# Patient Record
Sex: Male | Born: 1970
Health system: Southern US, Community
[De-identification: ages and names within clinical notes are randomized; demographics above are authoritative.]

---

## 1998-06-16 ENCOUNTER — Emergency Department (HOSPITAL_COMMUNITY): Admission: EM | Admit: 1998-06-16 | Discharge: 1998-06-16 | Payer: Self-pay | Admitting: Internal Medicine

## 1998-07-13 ENCOUNTER — Emergency Department (HOSPITAL_COMMUNITY): Admission: EM | Admit: 1998-07-13 | Discharge: 1998-07-13 | Payer: Self-pay | Admitting: Emergency Medicine

## 1999-01-12 ENCOUNTER — Emergency Department (HOSPITAL_COMMUNITY): Admission: EM | Admit: 1999-01-12 | Discharge: 1999-01-12 | Payer: Self-pay

## 1999-07-31 ENCOUNTER — Emergency Department (HOSPITAL_COMMUNITY): Admission: EM | Admit: 1999-07-31 | Discharge: 1999-07-31 | Payer: Self-pay | Admitting: Emergency Medicine

## 2001-02-07 ENCOUNTER — Emergency Department (HOSPITAL_COMMUNITY): Admission: EM | Admit: 2001-02-07 | Discharge: 2001-02-07 | Payer: Self-pay | Admitting: Emergency Medicine

## 2001-02-10 ENCOUNTER — Emergency Department (HOSPITAL_COMMUNITY): Admission: EM | Admit: 2001-02-10 | Discharge: 2001-02-10 | Payer: Self-pay

## 2001-08-06 ENCOUNTER — Inpatient Hospital Stay (HOSPITAL_COMMUNITY): Admission: EM | Admit: 2001-08-06 | Discharge: 2001-08-07 | Payer: Self-pay | Admitting: Emergency Medicine

## 2001-08-06 ENCOUNTER — Encounter (INDEPENDENT_AMBULATORY_CARE_PROVIDER_SITE_OTHER): Payer: Self-pay | Admitting: Specialist

## 2001-08-06 ENCOUNTER — Encounter: Payer: Self-pay | Admitting: Emergency Medicine

## 2002-06-02 ENCOUNTER — Encounter: Payer: Self-pay | Admitting: Emergency Medicine

## 2002-06-02 ENCOUNTER — Emergency Department (HOSPITAL_COMMUNITY): Admission: EM | Admit: 2002-06-02 | Discharge: 2002-06-02 | Payer: Self-pay | Admitting: Emergency Medicine

## 2004-03-10 ENCOUNTER — Emergency Department (HOSPITAL_COMMUNITY): Admission: EM | Admit: 2004-03-10 | Discharge: 2004-03-10 | Payer: Self-pay | Admitting: Emergency Medicine

## 2006-07-03 ENCOUNTER — Emergency Department (HOSPITAL_COMMUNITY): Admission: EM | Admit: 2006-07-03 | Discharge: 2006-07-03 | Payer: Self-pay | Admitting: Emergency Medicine

## 2006-08-12 ENCOUNTER — Emergency Department (HOSPITAL_COMMUNITY): Admission: EM | Admit: 2006-08-12 | Discharge: 2006-08-12 | Payer: Self-pay | Admitting: Emergency Medicine

## 2008-12-30 ENCOUNTER — Encounter: Admission: RE | Admit: 2008-12-30 | Discharge: 2008-12-30 | Payer: Self-pay | Admitting: Emergency Medicine

## 2010-12-04 ENCOUNTER — Encounter: Payer: Self-pay | Admitting: Emergency Medicine

## 2011-03-31 NOTE — Op Note (Signed)
[facility]  Patient:    Ronald Lawson, Ronald Lawson Visit Number: [id number] MRN: [id number]          Service Type: MED Location: 1E 0103 01 Attending Physician:  Tobey Bride Dictated by:   Zigmund Daniel, M.D. Proc. Date: 08/07/01 Admit Date:  08/06/2001                             Operative Report  PREOPERATIVE DIAGNOSIS:   Acute appendicitis.  POSTOPERATIVE DIAGNOSIS:  Acute appendicitis.  OPERATION:  Laparoscopic appendectomy.  SURGEON:  Zigmund Daniel, M.D.  ANESTHESIA:  General.  DESCRIPTION OF PROCEDURE:  After the patient was adequately monitored and anesthetized, she had routine preparation and draping of the abdomen.  I made a short transverse infraumbilical incision and sized the fascia longitudinally.  I opened the peritoneum bluntly, placed an 0 Vicryl pursestring suture in the fascia, secured a Hasson cannula and inflated the abdomen with C02.  I was able to identify retrocecal, partially retroperitoneal inflamed appendix.  I placed a 5 mm right mid abdominal port and an 11 mm suprapubic port under direct vision and then freed up adhesions of the appendix until I could get across it with the stapler.  It was rather broad-based near the cecum and in order to adequately secure the appendix and the mesentery required there firings of the endovascular stapler.  However, I then was able top detach the appendix, place it in a plastic pouch, and remove it through the umbilical incision.  Hemostasis was good.  There was no abscess or free fluid.  The closure of the cecum appeared secure.  The patient was stable throughout the operation.  All of the sponge, needle and instrument counts were correct.  I removed the appendix and a plastic pouch through an umbilical incision and tied the pursestring suture and removed the lateral port under direction vision and then removed the suprapubic port after releasing the C02.  I closed all skin  incisions with intracuticular 4-0 Vicryl and Steri-Strips.  He tolerated the operation well. Dictated by:   Zigmund Daniel, M.D. Attending Physician:  Tobey Bride DD:  08/07/01 TD:  08/07/01 Job: [id number]

## 2017-12-20 DIAGNOSIS — J069 Acute upper respiratory infection, unspecified: Secondary | ICD-10-CM | POA: Diagnosis not present

## 2017-12-20 DIAGNOSIS — J208 Acute bronchitis due to other specified organisms: Secondary | ICD-10-CM | POA: Diagnosis not present

## 2017-12-20 DIAGNOSIS — F411 Generalized anxiety disorder: Secondary | ICD-10-CM | POA: Diagnosis not present

## 2017-12-20 DIAGNOSIS — Z6825 Body mass index (BMI) 25.0-25.9, adult: Secondary | ICD-10-CM | POA: Diagnosis not present

## 2018-02-18 DIAGNOSIS — Z6826 Body mass index (BMI) 26.0-26.9, adult: Secondary | ICD-10-CM | POA: Diagnosis not present

## 2018-02-18 DIAGNOSIS — J302 Other seasonal allergic rhinitis: Secondary | ICD-10-CM | POA: Diagnosis not present

## 2018-04-22 DIAGNOSIS — Z1331 Encounter for screening for depression: Secondary | ICD-10-CM | POA: Diagnosis not present

## 2018-04-22 DIAGNOSIS — R6882 Decreased libido: Secondary | ICD-10-CM | POA: Diagnosis not present

## 2018-04-22 DIAGNOSIS — F411 Generalized anxiety disorder: Secondary | ICD-10-CM | POA: Diagnosis not present

## 2018-04-22 DIAGNOSIS — Z0000 Encounter for general adult medical examination without abnormal findings: Secondary | ICD-10-CM | POA: Diagnosis not present

## 2018-04-22 DIAGNOSIS — Z6826 Body mass index (BMI) 26.0-26.9, adult: Secondary | ICD-10-CM | POA: Diagnosis not present

## 2018-05-17 DIAGNOSIS — Z6825 Body mass index (BMI) 25.0-25.9, adult: Secondary | ICD-10-CM | POA: Diagnosis not present

## 2018-05-17 DIAGNOSIS — M25572 Pain in left ankle and joints of left foot: Secondary | ICD-10-CM | POA: Diagnosis not present

## 2018-11-26 DIAGNOSIS — F411 Generalized anxiety disorder: Secondary | ICD-10-CM | POA: Diagnosis not present

## 2018-11-26 DIAGNOSIS — R001 Bradycardia, unspecified: Secondary | ICD-10-CM | POA: Diagnosis not present

## 2018-11-26 DIAGNOSIS — Z6826 Body mass index (BMI) 26.0-26.9, adult: Secondary | ICD-10-CM | POA: Diagnosis not present

## 2021-09-22 ENCOUNTER — Telehealth: Payer: Self-pay

## 2021-09-22 NOTE — Telephone Encounter (Signed)
NOTES SCANNED TO REFERRAL 

## 2021-10-12 ENCOUNTER — Other Ambulatory Visit: Payer: Self-pay

## 2021-10-12 ENCOUNTER — Ambulatory Visit (INDEPENDENT_AMBULATORY_CARE_PROVIDER_SITE_OTHER): Payer: Self-pay | Admitting: Cardiovascular Disease

## 2021-10-12 ENCOUNTER — Encounter (HOSPITAL_BASED_OUTPATIENT_CLINIC_OR_DEPARTMENT_OTHER): Payer: Self-pay | Admitting: Cardiovascular Disease

## 2021-10-12 VITALS — BP 116/62 | HR 54 | Ht 76.0 in | Wt 210.2 lb

## 2021-10-12 DIAGNOSIS — E7800 Pure hypercholesterolemia, unspecified: Secondary | ICD-10-CM

## 2021-10-12 DIAGNOSIS — E7849 Other hyperlipidemia: Secondary | ICD-10-CM

## 2021-10-12 HISTORY — DX: Pure hypercholesterolemia, unspecified: E78.00

## 2021-10-12 NOTE — Progress Notes (Signed)
Cardiology Office Note:    Date:  10/12/2021   ID:  Ronald Lawson, DOB 08/24/1971, MRN [id number]  PCP:  Jim Like, NP  Cardiologist:  None    Referring MD: Jim Like, NP   No chief complaint on file.   History of Present Illness:    Ronald Lawson is a 50 y.o. male with a hx of hyperlipidemia, bradycardia, and anxiety for evaluation of hyperlipidemia at the request of Wenda Low, NP.  Today, he is doing well and is accompanied by his wife. He was first told he had high cholesterol 1 year ago and the numbers have been gradually worsening. 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 He has always worked in [employer]. He smoked briefly as a teenager. He does not drink and his last beer was in 2008. His maternal grandmother passed away of leukemia. However, his mother, father, and sister do not have any major cardiovascular issues that he knows of. For his diet, he tries to eat a plant-based diet and opts for low-fat foods and salads. He enjoys fish and chicken, typically baked or grilled, and occasionally has red meat. He enjoys carbohydrates but avoid fried foods. He avoids sugars. He primarily drinks water and occasionally has a glass of milk or apple or orange juice. He takes supplements such as omega-3 and fiber shakes. He denies any palpitations, chest pain, or shortness of breath, lightheadedness, headaches, syncope, orthopnea, PND, lower extremity edema or exertional symptoms.  Past Medical History:  Diagnosis Date   Pure hypercholesterolemia 10/12/2021    History reviewed. No pertinent surgical history.  Current Medications: Current Meds  Medication Sig   citalopram (CELEXA) 20 MG tablet 20 mg.   clonazePAM (KLONOPIN) 0.5 MG tablet Take by mouth.     Allergies:   Patient has no allergy information on record.   Social History   Socioeconomic History   Marital status: Married    Spouse  name: Not on file   Number of children: Not on file   Years of education: Not on file   Highest education level: Not on file  Occupational History   Not on file  Tobacco Use   Smoking status: Former    Types: Cigarettes   Smokeless tobacco: Never   Tobacco comments:    Minimal smoking as a teen  Substance and Sexual Activity   Alcohol use: Not Currently   Drug use: Never   Sexual activity: Not on file  Other Topics Concern   Not on file  Social History Narrative   Not on file   Social Determinants of Health   Financial Resource Strain: Not on file  Food Insecurity: Not on file  Transportation Needs: Not on file  Physical Activity: Not on file  Stress: Not on file  Social Connections: Not on file     Family History: The patient's family history includes Leukemia in his maternal grandmother.  ROS:   Please see the history of present illness.    All other systems reviewed and negative.   EKGs/Labs/Other Studies Reviewed:    The following studies were reviewed today: No prior cardiovascular studies  EKG:   10/12/21: Sinus bradycardia, rate 54 bpm  Recent Labs: No results found for requested labs within last 8760 hours.   Recent Lipid Panel No results found for: CHOL, TRIG, HDL, CHOLHDL, VLDL, LDLCALC, LDLDIRECT  CHA2DS2-VASc Score =   [ ] .  Therefore, the patient's annual risk of stroke is   %.     Physical Exam:    VS:  BP 116/62   Pulse (!) 54   Ht 6\' 4"  (1.93 m)   Wt 210 lb 3.2 oz (95.3 kg)   BMI 25.59 kg/m  , BMI Body mass index is 25.59 kg/m. GENERAL:  Well appearing HEENT: Pupils equal round and reactive, fundi not visualized, oral mucosa unremarkable NECK:  No jugular venous distention, waveform within normal limits, carotid upstroke brisk and symmetric, no bruits, no thyromegaly LUNGS:  Clear to auscultation bilaterally HEART:  RRR.  PMI not displaced or sustained,S1 and S2 within normal limits, no S3, no S4, no clicks, no rubs, no murmurs ABD:   Flat, positive bowel sounds normal in frequency in pitch, no bruits, no rebound, no guarding, no midline pulsatile mass, no hepatomegaly, no splenomegaly EXT:  2 plus pulses throughout, no edema, no cyanosis no clubbing SKIN:  No rashes no nodules NEURO:  Cranial nerves II through XII grossly intact, motor grossly intact throughout PSYCH:  Cognitively intact, oriented to person place and time   ASSESSMENT:    1. Other hyperlipidemia   2. Pure hypercholesterolemia    PLAN:    Pure hypercholesterolemia Overall Mr. Rossitto has a healthy lifestyle.  Despite exercising and having a good diet he has significant hyperlipidemia.  I suspect this is familial hyperlipidemia based on his levels.  However he does not have a significant family history of cardiovascular disease, especially not premature CAD.  His ASCVD 10-year risk is 5%.  We will get an LP(a) and an NMR lipid panel.  We will also get a coronary calcium score.  Based on this we will be able to continue discussion about whether or not he should be on a statin or other lipid-lowering therapies.  In order of problems listed above:     Medication Adjustments/Labs and Tests Ordered: Current medicines are reviewed at length with the patient today.  Concerns regarding medicines are outlined above.  Orders Placed This Encounter  Procedures   CT CARDIAC SCORING (SELF PAY ONLY)   NMR, lipoprofile   Lipoprotein A (LPA)    No orders of the defined types were placed in this encounter.  Disposition: FU with Chaka Boyson C. , MD, [facility] in 2-3 months  I,Mykaella Javier,acting as a scribe for [facility], MD.,have documented all relevant documentation on the behalf of Chilton Si, MD,as directed by  Chilton Si, MD while in the presence of Chilton Si, MD.  I, Enaya Howze C. Chilton Si, MD have reviewed all documentation for this visit.  The documentation of the exam, diagnosis, procedures, and orders on 10/12/2021 are all accurate  and complete.   Signed, 10/14/2021, MD  10/12/2021 6:21 PM    [facility]

## 2021-10-12 NOTE — Assessment & Plan Note (Signed)
Overall Mr. Ronald Lawson has a healthy lifestyle.  Despite exercising and having a good diet he has significant hyperlipidemia.  I suspect this is familial hyperlipidemia based on his levels.  However he does not have a significant family history of cardiovascular disease, especially not premature CAD.  His ASCVD 10-year risk is 5%.  We will get an LP(a) and an NMR lipid panel.  We will also get a coronary calcium score.  Based on this we will be able to continue discussion about whether or not he should be on a statin or other lipid-lowering therapies.

## 2021-10-12 NOTE — Patient Instructions (Signed)
Medication Instructions:  Your physician recommends that you continue on your current medications as directed. Please refer to the Current Medication list given to you today.   *If you need a refill on your cardiac medications before your next appointment, please call your pharmacy*  Lab Work: FASTING NMR/LPa  If you have labs (blood work) drawn today and your tests are completely normal, you will receive your results only by: MyChart Message (if you have MyChart) OR A paper copy in the mail If you have any lab test that is abnormal or we need to change your treatment, we will call you to review the results.  Testing/Procedures: CALCIUM SCORE, THIS WILL COST YOU $99 OUT OF POCKET   Follow-Up: At [facility], you and your health needs are our priority.  As part of our continuing mission to provide you with exceptional heart care, we have created designated Provider Care Teams.  These Care Teams include your primary Cardiologist (physician) and Advanced Practice Providers (APPs -  Physician Assistants and Nurse Practitioners) who all work together to provide you with the care you need, when you need it.  We recommend signing up for the patient portal called "MyChart".  Sign up information is provided on this After Visit Summary.  MyChart is used to connect with patients for Virtual Visits (Telemedicine).  Patients are able to view lab/test results, encounter notes, upcoming appointments, etc.  Non-urgent messages can be sent to your provider as well.   To learn more about what you can do with MyChart, go to [email or web address].    Your next appointment:   3 month(s)  The format for your next appointment:   In Person  Provider:   Chilton Si, MD

## 2021-10-13 LAB — NMR, LIPOPROFILE
Cholesterol, Total: 269 mg/dL — ABNORMAL HIGH (ref 100–199)
HDL Particle Number: 28.9 umol/L — ABNORMAL LOW (ref >=30.5)
HDL-C: 50 mg/dL (ref >39)
LDL Particle Number: 2071 nmol/L — ABNORMAL HIGH (ref <1000)
LDL Size: 20.6 nm (ref >20.5)
LDL-C (NIH Calc): 202 mg/dL — ABNORMAL HIGH (ref 0–99)
LP-IR Score: 44 (ref <=45)
Small LDL Particle Number: 1036 nmol/L — ABNORMAL HIGH (ref <=527)
Triglycerides: 98 mg/dL (ref 0–149)

## 2021-10-13 LAB — LIPOPROTEIN A (LPA): Lipoprotein (a): <8.4 nmol/L (ref <75.0)

## 2021-10-14 NOTE — Addendum Note (Signed)
Addended by: Marlene Lard on: 10/14/2021 04:39 PM   Modules accepted: Orders

## 2021-11-11 ENCOUNTER — Ambulatory Visit (INDEPENDENT_AMBULATORY_CARE_PROVIDER_SITE_OTHER)
Admission: RE | Admit: 2021-11-11 | Discharge: 2021-11-11 | Disposition: A | Discharge status: Home / Self Care | Payer: Self-pay | Source: Ambulatory Visit | Attending: Cardiovascular Disease | Admitting: Cardiovascular Disease

## 2021-11-11 ENCOUNTER — Other Ambulatory Visit: Payer: Self-pay

## 2021-11-11 ENCOUNTER — Encounter (HOSPITAL_BASED_OUTPATIENT_CLINIC_OR_DEPARTMENT_OTHER): Payer: Self-pay | Admitting: Cardiovascular Disease

## 2021-11-11 DIAGNOSIS — E7849 Other hyperlipidemia: Secondary | ICD-10-CM

## 2022-01-11 ENCOUNTER — Ambulatory Visit (HOSPITAL_BASED_OUTPATIENT_CLINIC_OR_DEPARTMENT_OTHER): Payer: Managed Care, Other (non HMO) | Admitting: Cardiovascular Disease

## 2022-01-11 ENCOUNTER — Other Ambulatory Visit: Payer: Self-pay

## 2022-01-11 ENCOUNTER — Encounter (HOSPITAL_BASED_OUTPATIENT_CLINIC_OR_DEPARTMENT_OTHER): Payer: Self-pay | Admitting: Cardiovascular Disease

## 2022-01-11 VITALS — BP 110/70 | HR 54 | Ht 76.0 in | Wt 206.5 lb

## 2022-01-11 DIAGNOSIS — I700 Atherosclerosis of aorta: Secondary | ICD-10-CM | POA: Diagnosis not present

## 2022-01-11 DIAGNOSIS — Z5181 Encounter for therapeutic drug level monitoring: Secondary | ICD-10-CM

## 2022-01-11 DIAGNOSIS — E7800 Pure hypercholesterolemia, unspecified: Secondary | ICD-10-CM

## 2022-01-11 HISTORY — DX: Atherosclerosis of aorta: I70.0

## 2022-01-11 MED ORDER — ROSUVASTATIN CALCIUM 10 MG PO TABS: 10.0000 mg | ORAL_TABLET | Freq: Every day | ORAL | 3 refills | Status: DC

## 2022-01-11 NOTE — Patient Instructions (Signed)
Medication Instructions:  °START ROSUVASTATIN 10 MG DAILY  ° °*If you need a refill on your cardiac medications before your next appointment, please call your pharmacy* ° °Lab Work: °FASTING LP/CMET IN 3 MONTHS ° °If you have labs (blood work) drawn today and your tests are completely normal, you will receive your results only by: °MyChart Message (if you have MyChart) OR °A paper copy in the mail °If you have any lab test that is abnormal or we need to change your treatment, we will call you to review the results. ° °Testing/Procedures: °NONE ° °Follow-Up: °At [facility], you and your health needs are our priority.  As part of our continuing mission to provide you with exceptional heart care, we have created designated Provider Care Teams.  These Care Teams include your primary Cardiologist (physician) and Advanced Practice Providers (APPs -  Physician Assistants and Nurse Practitioners) who all work together to provide you with the care you need, when you need it. ° °We recommend signing up for the patient portal called "MyChart".  Sign up information is provided on this After Visit Summary.  MyChart is used to connect with patients for Virtual Visits (Telemedicine).  Patients are able to view lab/test results, encounter notes, upcoming appointments, etc.  Non-urgent messages can be sent to your provider as well.   °To learn more about what you can do with MyChart, go to [email or web address].   ° °Your next appointment:   °12 month(s) ° °The format for your next appointment:   °In Person ° °Provider:   °Tiffany [place], MD  ° ° ° °

## 2022-01-11 NOTE — Progress Notes (Signed)
?Cardiology Office Note:   ? ?Date:  01/11/2022  ? ?ID:  Ronald Lawson, DOB [birth year], MRN [id number] ? ?PCP:  Jim Like, NP  ?Cardiologist:  None   ? ?Referring MD: Jim Like, NP  ? ?No chief complaint on file. ? ? ?History of Present Illness:   ? ?Ronald Lawson is a 51 y.o. male with a hx of hyperlipidemia, bradycardia, and anxiety for follow-up of hyperlipidemia at the request of Wenda Low, NP.  He was first seen on 10/12/2021. At that time, he was active and had no symptoms. He wanted to avoid statins as much as possible. He had an NMR, lipid panel with a high LDL particle number, LPA was low. He had a calcium score 10/2021 with no plaque. ? ?Today, he is accompanied by his wife.  He reports still being active and is asymptomatic.  ?He reports he has been watching his diet "off and on" for the past 2 years. He is willing to start a low-dose statin because his PCP has also advised him on the importance of starting medication.  He denies chest pain, shortness of breath, palpitations, lightheadedness, headaches, syncope, LE edema, orthopnea, PND.  ? ? ?Past Medical History:  ?Diagnosis Date  ? Aortic atherosclerosis (HCC) 01/11/2022  ? Pure hypercholesterolemia 10/12/2021  ? ? ?History reviewed. No pertinent surgical history. ? ?Current Medications: ?Current Meds  ?Medication Sig  ? ALPRAZolam (XANAX) 0.5 MG tablet Take 0.5 mg by mouth 2 (two) times daily as needed.  ? citalopram (CELEXA) 20 MG tablet 20 mg.  ? rosuvastatin (CRESTOR) 10 MG tablet Take 1 tablet (10 mg total) by mouth daily.  ?  ? ?Allergies:   Patient has no allergy information on record.  ? ?Social History  ? ?Socioeconomic History  ? Marital status: Married  ?  Spouse name: Not on file  ? Number of children: Not on file  ? Years of education: Not on file  ? Highest education level: Not on file  ?Occupational History  ? Not on file  ?Tobacco Use  ? Smoking status: Former  ?  Types: Cigarettes  ? Smokeless tobacco: Never  ? Tobacco comments:  ?   Minimal smoking as a teen  ?Substance and Sexual Activity  ? Alcohol use: Not Currently  ? Drug use: Never  ? Sexual activity: Not on file  ?Other Topics Concern  ? Not on file  ?Social History Narrative  ? Not on file  ? ?Social Determinants of Health  ? ?Financial Resource Strain: Not on file  ?Food Insecurity: Not on file  ?Transportation Needs: Not on file  ?Physical Activity: Not on file  ?Stress: Not on file  ?Social Connections: Not on file  ?  ? ?Family History: ?The patient's family history includes Leukemia in his maternal grandmother. ? ?ROS:   ?Please see the history of present illness.    ?All other systems reviewed and negative.  ? ?EKGs/Labs/Other Studies Reviewed:   ? ?The following studies were reviewed today: ?No prior cardiovascular studies ? ?EKG:   ?10/12/21: Sinus bradycardia, rate 54 bpm ? ?Recent Labs: ?No results found for requested labs within last 8760 hours.  ? ?Recent Lipid Panel ?No results found for: CHOL, TRIG, HDL, CHOLHDL, VLDL, LDLCALC, LDLDIRECT ? ? ?Physical Exam:   ? ?VS:  BP 110/70 (BP Location: Left Arm, Patient Position: Sitting, Cuff Size: Normal)   Pulse (!) 54   Ht 6\' 4"  (1.93 m)   Wt 206 lb 8 oz (93.7  kg)   SpO2 97%   BMI 25.14 kg/m?  , BMI Body mass index is 25.14 kg/m?. ?GENERAL:  Well appearing ?HEENT: Pupils equal round and reactive, fundi not visualized, oral mucosa unremarkable ?NECK:  No jugular venous distention, waveform within normal limits, carotid upstroke brisk and symmetric, no bruits, no thyromegaly ?LUNGS:  Clear to auscultation bilaterally ?HEART:  RRR.  PMI not displaced or sustained,S1 and S2 within normal limits, no S3, no S4, no clicks, no rubs, no murmurs ?ABD:  Flat, positive bowel sounds normal in frequency in pitch, no bruits, no rebound, no guarding, no midline pulsatile mass, no hepatomegaly, no splenomegaly ?EXT:  2 plus pulses throughout, no edema, no cyanosis no clubbing ?SKIN:  No rashes no nodules ?NEURO:  Cranial nerves II through  XII grossly intact, motor grossly intact throughout ?PSYCH:  Cognitively intact, oriented to person place and time ? ?ASSESSMENT:   ? ?1. Pure hypercholesterolemia   ?2. Aortic atherosclerosis (HCC)   ?3. Therapeutic drug monitoring   ? ? ?PLAN:   ? ?Aortic atherosclerosis (HCC) ?Aortic atherosclerosis noted on CT. Continue working on diet.  He remains very active at work.  We will add rosuvastatin 10 mg daily.  Check fasting lipids and a CMP in a few months.  LDL goal should be less than 70. ? ?Pure hypercholesterolemia ?Lipids have been elevated despite diet and exercise.  He has been hesitant to start medication.  NMR profile was indicative of high risk.  LP(a) was low risk.  However he had aortic atherosclerosis on CT.  Therefore the risk-benefit points towards benefit of starting statin.  He is in agreement and will start rosuvastatin 10 mg daily as above. ? ? ?In order of problems listed above: ? ? ?Medication Adjustments/Labs and Tests Ordered: ?Current medicines are reviewed at length with the patient today.  Concerns regarding medicines are outlined above.  ?Orders Placed This Encounter  ?Procedures  ? Lipid panel  ? Comprehensive metabolic panel  ? ? ?Meds ordered this encounter  ?Medications  ? rosuvastatin (CRESTOR) 10 MG tablet  ?  Sig: Take 1 tablet (10 mg total) by mouth daily.  ?  Dispense:  90 tablet  ?  Refill:  3  ? ? ?Disposition: FU with Chandler Stofer C. Duke Salvia, MD, [facility] in 1 year ?Lab work in 3 months  ? ? 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  ? ?I, Algie Westry C. Duke Salvia, MD have reviewed all documentation for this visit.  The documentation of the exam, diagnosis, procedures, and orders on 01/11/2022 are all accurate and complete. ? ? ?Signed, ?Chilton Si, MD  ?01/11/2022 8:31 AM    ?[facility] ? ?

## 2022-01-11 NOTE — Assessment & Plan Note (Signed)
Aortic atherosclerosis noted on CT. Continue working on diet.  He remains very active at work.  We will add rosuvastatin 10 mg daily.  Check fasting lipids and a CMP in a few months.  LDL goal should be less than 70. ?

## 2022-01-11 NOTE — Assessment & Plan Note (Signed)
Lipids have been elevated despite diet and exercise.  He has been hesitant to start medication.  NMR profile was indicative of high risk.  LP(a) was low risk.  However he had aortic atherosclerosis on CT.  Therefore the risk-benefit points towards benefit of starting statin.  He is in agreement and will start rosuvastatin 10 mg daily as above. ?

## 2022-01-30 ENCOUNTER — Other Ambulatory Visit (HOSPITAL_BASED_OUTPATIENT_CLINIC_OR_DEPARTMENT_OTHER): Payer: Self-pay | Admitting: *Deleted

## 2022-01-30 MED ORDER — ROSUVASTATIN CALCIUM 10 MG PO TABS: 10.0000 mg | ORAL_TABLET | Freq: Every day | ORAL | 3 refills | Status: DC

## 2022-01-30 NOTE — Telephone Encounter (Signed)
Rx(s) sent to pharmacy electronically.  

## 2022-07-05 IMAGING — CT CT CARDIAC CORONARY ARTERY CALCIUM SCORE
3 series · 14 of 20 positions shown, 16 images · non-contrast
Comparison: None.
COMPARISON: None.

Addendum:
EXAM:
OVER-READ INTERPRETATION  CT CHEST

The following report is an over-read performed by radiologist Dr.
Kazantzidis Marilena [REDACTED] on 11/11/2021. This
over-read does not include interpretation of cardiac or coronary
anatomy or pathology. The coronary calcium score/coronary CTA
interpretation by the cardiologist is attached.
CLINICAL DATA: 50M for cardiovascular disease risk stratification
Coronary Calcium Score
TECHNIQUE: A gated, non-contrast computed tomography scan of the heart was
performed using 3mm slice thickness. Axial images were analyzed on a
dedicated workstation. Calcium scoring of the coronary arteries was
performed using the Agatston method.

[Series 2: cascseq 2.0 sa36 70% (id) · axial · 0.39mm/px · z∈[-260,-158]mm · 4 of 85 slices shown]
[im 17/85  vessel]
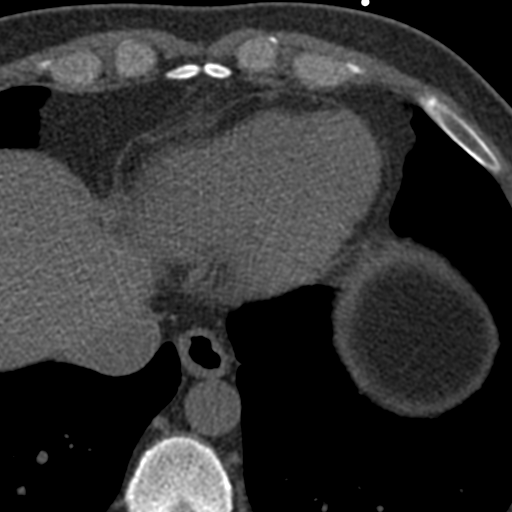
[im 34/85  vessel]
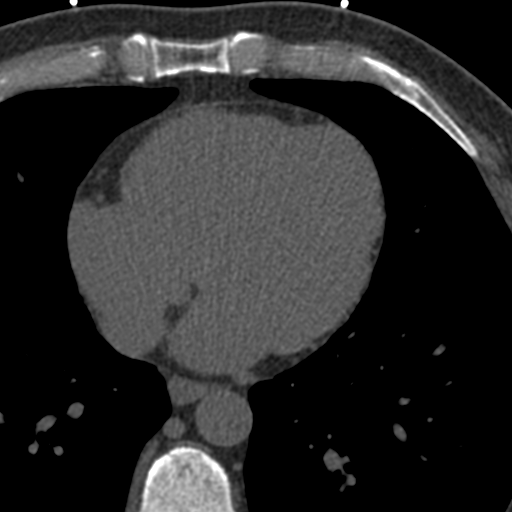
[im 51/85  vessel]
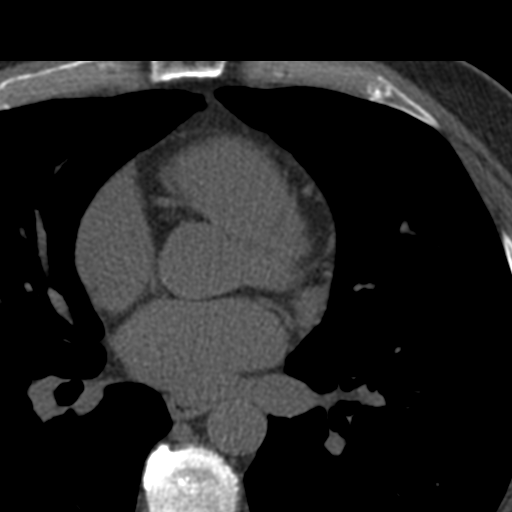
[im 68/85  vessel]
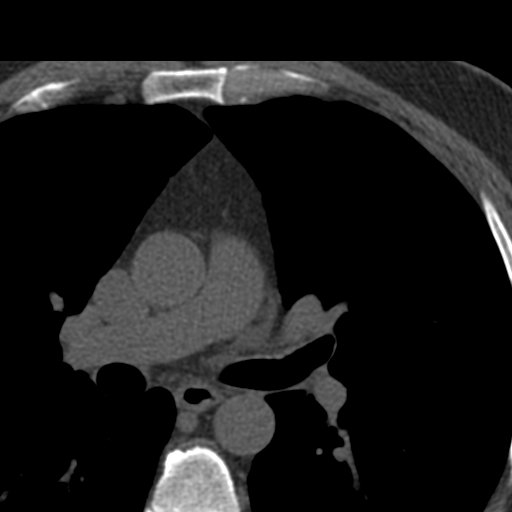

[Series 3: cascseq 2.0 bf37 st · axial · 0.73mm/px · z∈[-264,-152]mm · 5 of 85 slices shown, 7 images]
[im 15/85  vessel]
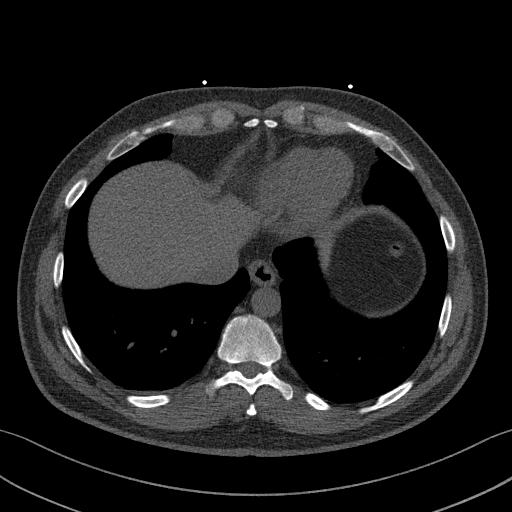
[im 15/85  lung]
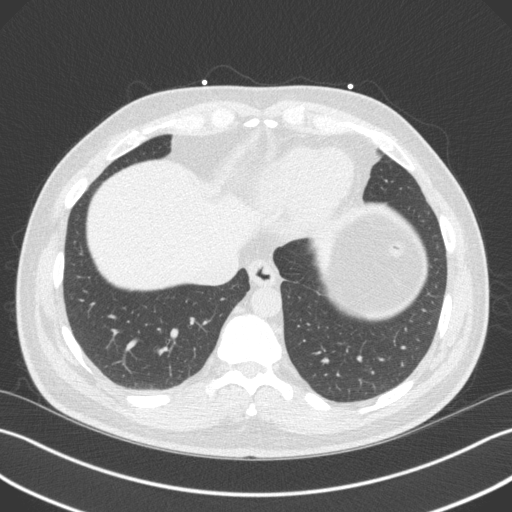
[im 29/85  vessel]
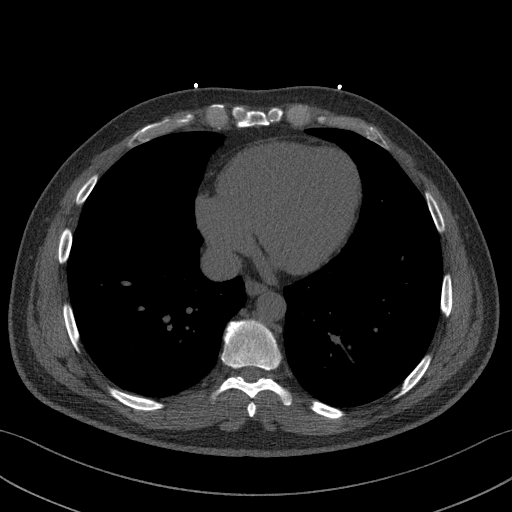
[im 43/85  vessel]
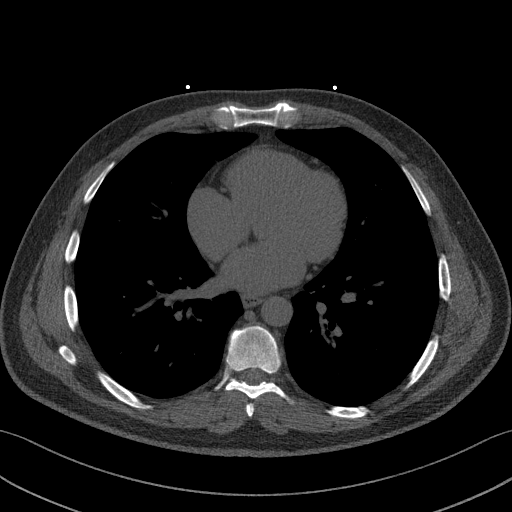
[im 57/85  vessel]
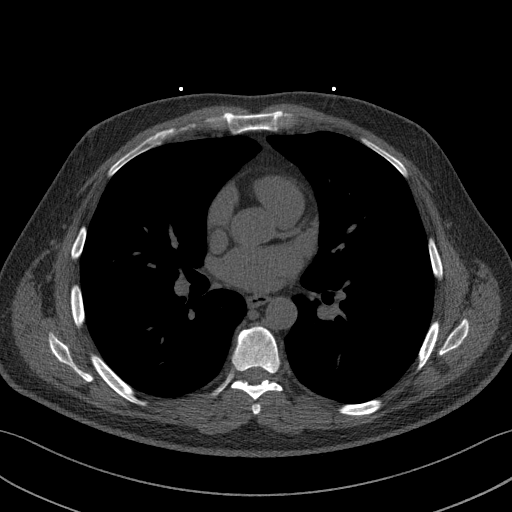
[im 71/85  vessel]
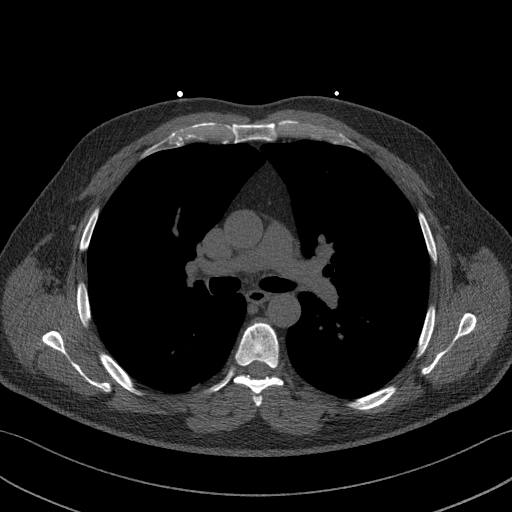
[im 71/85  lung]
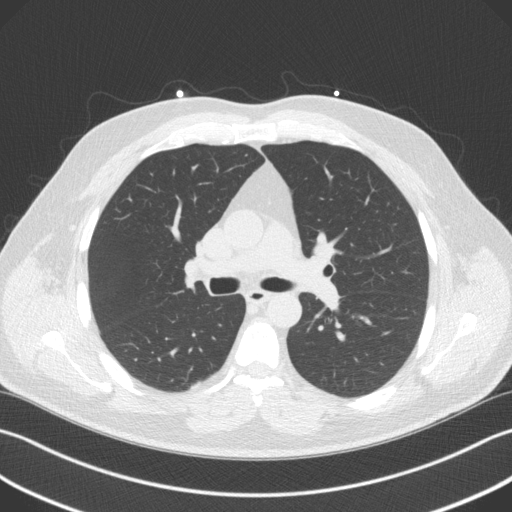

[Series 4: cascseq 2.0 br59 lung · axial · 0.73mm/px · z∈[-264,-152]mm · 5 of 85 slices shown]
[im 15/85  lung]
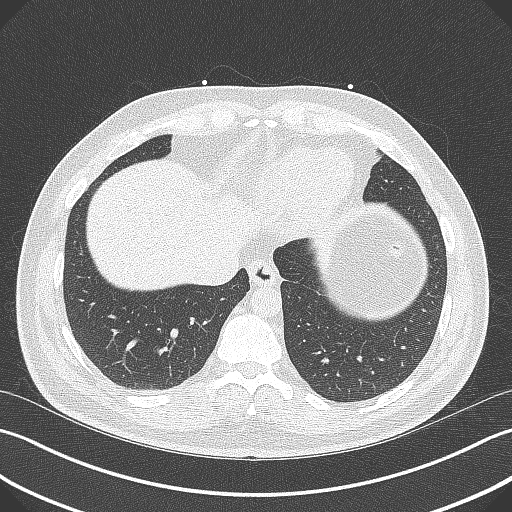
[im 29/85  lung]
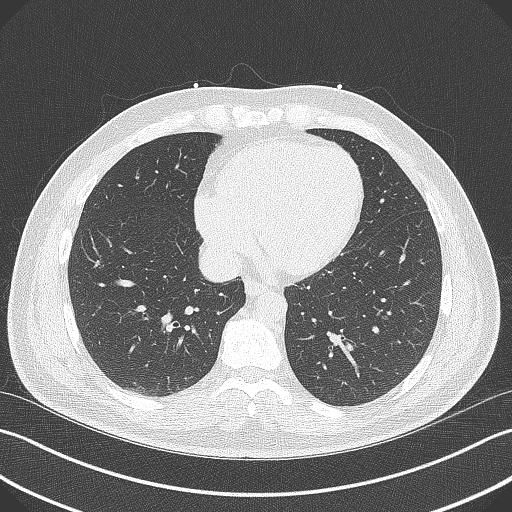
[im 43/85  lung]
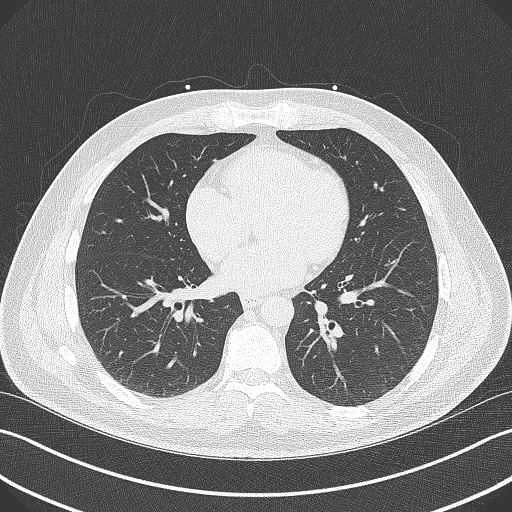
[im 57/85  lung]
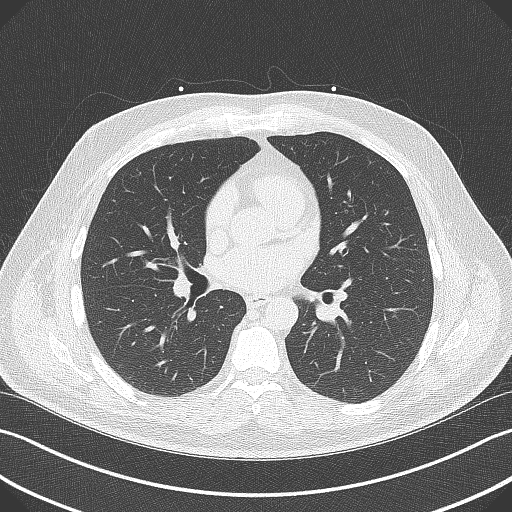
[im 71/85  lung]
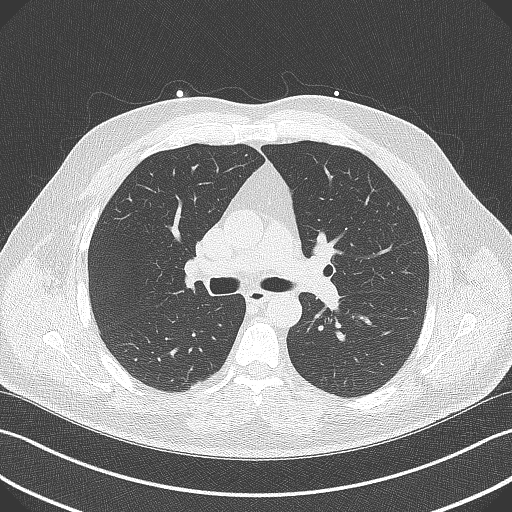

[14 of 20 positions shown; findings below may reference images not displayed]

FINDINGS: Atherosclerotic calcifications in the thoracic aorta. Tiny calcified
granuloma in the right lower lobe. Within the visualized portions of
the thorax there are no suspicious appearing pulmonary nodules or
masses, there is no acute consolidative airspace disease, no pleural
effusions, no pneumothorax and no lymphadenopathy. Visualized
portions of the upper abdomen are unremarkable. There are no
aggressive appearing lytic or blastic lesions noted in the
visualized portions of the skeleton.
IMPRESSION: 1.  Aortic Atherosclerosis (4AK5S-6N4.4).
FINDINGS: Coronary arteries: Normal origins.

Coronary Calcium Score: 0

Pericardium: Normal.

Ascending Aorta: Normal caliber.

Non-cardiac: See separate report from [REDACTED].
IMPRESSION: Coronary calcium score of 0. This was 0 percentile for age-, race-,
and sex-matched controls.



If CAC=0, it is reasonable to withhold statin therapy and reassess
in 5 to 10 years, as long as higher risk conditions are absent
(diabetes mellitus, family history of premature CHD in first degree
relatives (males <55 years; females <65 years), cigarette smoking,
or LDL >=190 mg/dL).

If CAC is 1 to 99, it is reasonable to initiate statin therapy for
patients >=55 years of age.

If CAC is >=100 or >=75th percentile, it is reasonable to initiate
statin therapy at any age.

Cardiology referral should be considered for patients with CAC
scores >=400 or >=75th percentile.

*5031 AHA/ACC/AACVPR/AAPA/ABC/BITCOINFAN/LUANNE/BALDACCHINO/Tiger/TIP/CROTTY/DATGUY
Guideline on the Management of Blood Cholesterol: A Report of the
American College of Cardiology/American Heart Association Task Force
on Clinical Practice Guidelines. J Am Coll Cardiol.
7021;73(24):5691-5129.

*** End of Addendum ***
EXAM:
OVER-READ INTERPRETATION  CT CHEST

The following report is an over-read performed by radiologist Dr.
Kazantzidis Marilena [REDACTED] on 11/11/2021. This
over-read does not include interpretation of cardiac or coronary
anatomy or pathology. The coronary calcium score/coronary CTA
interpretation by the cardiologist is attached.
FINDINGS: Atherosclerotic calcifications in the thoracic aorta. Tiny calcified
granuloma in the right lower lobe. Within the visualized portions of
the thorax there are no suspicious appearing pulmonary nodules or
masses, there is no acute consolidative airspace disease, no pleural
effusions, no pneumothorax and no lymphadenopathy. Visualized
portions of the upper abdomen are unremarkable. There are no
aggressive appearing lytic or blastic lesions noted in the
visualized portions of the skeleton.
IMPRESSION: 1.  Aortic Atherosclerosis (4AK5S-6N4.4).

## 2022-09-27 ENCOUNTER — Telehealth: Payer: Self-pay | Admitting: Cardiovascular Disease

## 2022-09-27 NOTE — Telephone Encounter (Signed)
Pt requesting a provider switch from Dr. Duke Salvia to Dr. Tomie China

## 2022-11-02 NOTE — Telephone Encounter (Signed)
Called pt to inform him of switch. Pt will wait until after his appt with Dan Humphreys, NP on 11/15/22 to f/u with Dr. Tomie China in [place].

## 2022-11-15 ENCOUNTER — Ambulatory Visit (INDEPENDENT_AMBULATORY_CARE_PROVIDER_SITE_OTHER): Payer: Commercial Managed Care - HMO | Admitting: Family

## 2022-11-15 ENCOUNTER — Encounter (HOSPITAL_BASED_OUTPATIENT_CLINIC_OR_DEPARTMENT_OTHER): Payer: Self-pay | Admitting: Family

## 2022-11-15 VITALS — BP 110/66 | HR 53 | Ht 76.0 in | Wt 216.2 lb

## 2022-11-15 DIAGNOSIS — I700 Atherosclerosis of aorta: Secondary | ICD-10-CM

## 2022-11-15 DIAGNOSIS — E785 Hyperlipidemia, unspecified: Secondary | ICD-10-CM

## 2022-11-15 MED ORDER — ROSUVASTATIN CALCIUM 10 MG PO TABS: 10.0000 mg | ORAL_TABLET | ORAL | 3 refills | Status: DC

## 2022-11-15 NOTE — Progress Notes (Signed)
Office Visit    Patient Name: Ronald Lawson Date of Encounter: 11/15/2022  PCP:  Earlyne Iba, NP   [facility]  Cardiologist:  Jenean Lindau, MD  Advanced Practice Provider:  No care team member to display Electrophysiologist:  None      Chief Complaint    Ronald Lawson is a 52 y.o. male presents today for hyperlipidemia   Past Medical History    Past Medical History:  Diagnosis Date   Aortic atherosclerosis ([place]) 01/11/2022   Pure hypercholesterolemia 10/12/2021   History reviewed. No pertinent surgical history.  Allergies  Not on File  History of Present Illness    Ronald Lawson is a 52 y.o. male with a hx of hyperlipidemia, bradycardia, anxiety, aortic atherosclerosis last seen 01/11/2022.  Initially saw Dr. Oval Linsey 09/2021 for high cholesterol.  He preferred to avoid statins is much as possible.  NMR, lipid panel with high LDL particle, LPA low.  Calcium score 10/2021 with no plaque.  Last seen 01/11/2022.  He reported being active and was asymptomatic.  Willing to start low-dose statin given aortic atherosclerosis and was started on rosuvastatin 10 mg daily.  09/26/2022 LDL 202, total cholesterol 264, HDL 49, triglycerides 79. 09/26/2022 ALT 30, AST 24, creatinine 1.14, GFR 77, hemoglobin 15.4.  He presents today for follow up with his wife. 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 Has been approved to switch to Dr. Geraldo Pitter due to location.   Notes when he was taking the Rosuvastatin he noted headaches. He is very hesitant regarding statins due to concern for headaches and myalgias. He is not sure if he had headaches while taking previously related to panic disorder, depression or the medication. He gradually added the Crestor until he was at 10mg  dosing.  He has not had headaches since that time. Lengthy time spent discussing hyperlipidemia management including statin, zetia, bempedoic acid, PCSK9i.  Reports no shortness of breath  nor dyspnea on exertion. Reports no chest pain, pressure, or tightness. No edema, orthopnea, PND. Reports no palpitations.    EKGs/Labs/Other Studies Reviewed:   The following studies were reviewed today:  CT cardiac score 11/11/21 IMPRESSION: 1.  Aortic Atherosclerosis (ICD10-I70.0).   FINDINGS: Coronary arteries: Normal origins.   Coronary Calcium Score: 0   Pericardium: Normal.   Ascending Aorta: Normal caliber.   Non-cardiac: See separate report from [facility].   IMPRESSION: Coronary calcium score of 0. This was 0 percentile for age-, race-, and sex-matched controls.  EKG:  EKG is  ordered today.  The ekg ordered today demonstrates SB 53 bpm with no acute ST/T wave changes.  Recent Labs: No results found for requested labs within last 365 days.  Recent Lipid Panel No results found for: "CHOL", "TRIG", "HDL", "CHOLHDL", "VLDL", "LDLCALC", "LDLDIRECT"  Home Medications   Current Meds  Medication Sig   citalopram (CELEXA) 10 MG tablet Take 5 mg by mouth daily. Take with 20 mg to make 25 mg total   citalopram (CELEXA) 20 MG tablet 20 mg.     Review of Systems      All other systems reviewed and are otherwise negative except as noted above.  Physical Exam    VS:  BP 110/66 (BP Location: Right Arm, Patient Position: Sitting, Cuff Size: Large)   Pulse (!) 53   Ht 6\' 4"  (1.93 m)   Wt 216 lb 3.2 oz (98.1 kg)   BMI 26.32 kg/m  , BMI Body mass index is  26.32 kg/m.  Wt Readings from Last 3 Encounters:  11/15/22 216 lb 3.2 oz (98.1 kg)  01/11/22 206 lb 8 oz (93.7 kg)  10/12/21 210 lb 3.2 oz (95.3 kg)    GEN: Well nourished, well developed, in no acute distress. HEENT: normal. Neck: Supple, no JVD, carotid bruits, or masses. Cardiac: RRR, no murmurs, rubs, or gallops. No clubbing, cyanosis, edema.  Radials/PT 2+ and equal bilaterally.  Respiratory:  Respirations regular and unlabored, clear to auscultation bilaterally. GI: Soft, nontender,  nondistended. MS: No deformity or atrophy. Skin: Warm and dry, no rash. Neuro:  Strength and sensation are intact. Psych: Normal affect.  Assessment & Plan    Aortic atherosclerosis/hyperlipidemia, LDL goal 70-11/30/2022 lipoprotein a less than 8.4.  09/2021 NMR with LDL 202, HDL 28.9, small LDL particle number 1036. 01/2022 was prescribed Rosuvastatin 10mg  QD but only took for a couple weeks. Most recent labs 09/26/2022 LDL 202, total cholesterol 264, HDL 49, triglycerides 79 with PCP. He is agreeable to trial Rosuvastatin 10mg  three times per week. MyChart message in 2 weeks to check in. We discussed addition of Zetia, Bempedoic acid, or PCSK9i as next agent if needed. Heart healthy diet and regular cardiovascular exercise encouraged.    Sinus bradycardia - asymptomatic with no lightheaded, dizziness. Avoid AV nodal blocking agent.        Disposition: Follow up in 3-4 month(s) with Jenean Lindau, MD or APP.  Signed, Loel Dubonnet, NP 11/15/2022, 1:27 PM [facility]

## 2022-11-15 NOTE — Patient Instructions (Addendum)
Medication Instructions:  Your physician has recommended you make the following change in your medication:   Start: Rosuvastatin 10mg  three times per week- Alonia Dibuono will call you in about two weeks to check on you   *If you need a refill on your cardiac medications before your next appointment, please call your pharmacy*  Follow-Up: At [facility], you and your health needs are our priority.  As part of our continuing mission to provide you with exceptional heart care, we have created designated Provider Care Teams.  These Care Teams include your primary Cardiologist (physician) and Advanced Practice Providers (APPs -  Physician Assistants and Nurse Practitioners) who all work together to provide you with the care you need, when you need it.  We recommend signing up for the patient portal called "MyChart".  Sign up information is provided on this After Visit Summary.  MyChart is used to connect with patients for Virtual Visits (Telemedicine).  Patients are able to view lab/test results, encounter notes, upcoming appointments, etc.  Non-urgent messages can be sent to your provider as well.   To learn more about what you can do with MyChart, go to [email or web address].    Your next appointment:   3-4 month(s)  The format for your next appointment:   In Person  Provider:   Laurann Montana, NP   Other Instructions If you do not tolerate Rosuvastatin we could trial...  Another statin called Pravastatin A non-statin medication called Ezetimibe (Zetia) A non-statin medication called Bempedoic acid (Nexletol). If we choose to go this route we can provide samples.  An injectable medication called PCSK9i (Repatha or Praluent) ___________________________________________  Prior CT scan showed aortic atherosclerosis (plaque buildup in the aorta).  We prevent this from worsening by keeping your cholesterol well-controlled with LDL (bad cholesterol) goal of less than 70.  Your most recent  cholesterol panel in November with primary care showed your LDL was 202 ___________________________________________  Natural remedies have not been found to be effective in lowering cholesterol and preventing progression of aortic atherosclerosis. I've linked a good article below for you if you are interested. It found that low dose statin was significant in reducing cholesterol compared to a number of supplements.   [email or web address]

## 2022-11-30 ENCOUNTER — Encounter (HOSPITAL_BASED_OUTPATIENT_CLINIC_OR_DEPARTMENT_OTHER): Payer: Self-pay

## 2022-11-30 NOTE — Telephone Encounter (Signed)
update

## 2023-02-09 LAB — COLOGUARD: COLOGUARD: NEGATIVE

## 2023-02-15 ENCOUNTER — Encounter (HOSPITAL_BASED_OUTPATIENT_CLINIC_OR_DEPARTMENT_OTHER): Payer: Self-pay | Admitting: Family

## 2023-02-15 ENCOUNTER — Ambulatory Visit (HOSPITAL_BASED_OUTPATIENT_CLINIC_OR_DEPARTMENT_OTHER): Payer: Commercial Managed Care - HMO | Admitting: Family

## 2023-02-15 VITALS — BP 100/65 | HR 59 | Ht 76.0 in | Wt 221.1 lb

## 2023-02-15 DIAGNOSIS — E785 Hyperlipidemia, unspecified: Secondary | ICD-10-CM | POA: Diagnosis not present

## 2023-02-15 DIAGNOSIS — R001 Bradycardia, unspecified: Secondary | ICD-10-CM

## 2023-02-15 DIAGNOSIS — I700 Atherosclerosis of aorta: Secondary | ICD-10-CM | POA: Diagnosis not present

## 2023-02-15 NOTE — Patient Instructions (Signed)
Medication Instructions:  Your physician has recommended you make the following change in your medication:   START Zetia 10mg  daily Okay to gradually work your way up to taking daily.  *If you need a refill on your cardiac medications before your next appointment, please call your pharmacy*   Lab Work: Your physician recommends that you return for lab work: 6 weeks after starting Zetia for fasting lipid panel and CMP  Please return for Lab work. You may come to the...   [facility] (3rd floor) [address], [place], [place] [address]  Open: 8am-Noon and 1pm-4:30pm  Please ring the doorbell on the small table when you exit the elevator and the Lab Tech will come get you  [place] [facility] [address] [place], [place], [place] [address] Open: 8am-1pm, then 2pm-4:30pm   [place]- Please see attached locations sheet stapled to your lab work with address and hours.    If you have labs (blood work) drawn today and your tests are completely normal, you will receive your results only by: [place] (if you have MyChart) OR A paper copy in the mail If you have any lab test that is abnormal or we need to change your treatment, we will call you to review the results.  Follow-Up: At [facility], you and your health needs are our priority.  As part of our continuing mission to provide you with exceptional heart care, we have created designated Provider Care Teams.  These Care Teams include your primary Cardiologist (physician) and Advanced Practice Providers (APPs -  Physician Assistants and Nurse Practitioners) who all work together to provide you with the care you need, when you need it.  We recommend signing up for the patient portal called "MyChart".  Sign up information is provided on this After Visit Summary.  MyChart is used to connect with patients for Virtual Visits (Telemedicine).  Patients are able to view lab/test  results, encounter notes, upcoming appointments, etc.  Non-urgent messages can be sent to your provider as well.   To learn more about what you can do with MyChart, go to [email or web address].    Your next appointment:   6 month(s)  Provider:   Skeet Latch, MD or Laurann Montana, NP    Other Instructions   Our goal is for your LDL (bad cholesterol) to be less than 70. We will start with the Zetia. If you do not tolerate or need an additional agent we may consider Bempedoic Acid (Nexletol).   Bempedoic Acid Tablets What is this medication? BEMPEDOIC ACID (BEM pe DOE ik AS id) treats high cholesterol. It works by reducing the amount of cholesterol made by your body. Changes to diet and exercise are often combined with this medication. This medicine may be used for other purposes; ask your health care provider or pharmacist if you have questions. COMMON BRAND NAME(S): NEXLETOL What should I tell my care team before I take this medication? They need to know if you have any of these conditions: Gout Kidney problems Liver disease Tendon problems An unusual or allergic reaction to bempedoic acid, other medications, foods, dyes, or preservatives Pregnant or trying to become pregnant Breast-feeding How should I use this medication? Take this medication by mouth. Take it as directed on the prescription label at the same time every day. You can take it with or without food. If it upsets your stomach, take it with food. Keep taking it unless your care team tells you  to stop. Talk to your care team about the use of this medication in children. Special care may be needed. Overdosage: If you think you have taken too much of this medicine contact a poison control center or emergency room at once. NOTE: This medicine is only for you. Do not share this medicine with others. What if I miss a dose? If you miss a dose, take it as soon as you can. If it is almost time for your next dose, take only  that dose. Do not take double or extra doses. What may interact with this medication? Pravastatin Simvastatin This list may not describe all possible interactions. Give your health care provider a list of all the medicines, herbs, non-prescription drugs, or dietary supplements you use. Also tell them if you smoke, drink alcohol, or use illegal drugs. Some items may interact with your medicine. What should I watch for while using this medication? Visit your care team for regular checks on your progress. Tell your care team if your symptoms do not start to get better or if they get worse. Your care team may tell you to stop taking this medication if you develop muscle problems. If your muscle problems do not go away after stopping this medication, contact your care team. Taking this medication is only part of a total heart healthy program. Ask your care team if there are other changes you can make to improve your overall health. What side effects may I notice from receiving this medication? Side effects that you should report to your care team as soon as possible: Allergic reactions--skin rash, itching, hives, swelling of the face, lips, tongue, or throat High uric acid level--severe pain, redness, warmth, or swelling in joints, pain or trouble passing urine, pain in the lower back or sides Joint, muscle, or tendon pain, swelling, or stiffness Side effects that usually do not require medical attention (report to your care team if they continue or are bothersome): Diarrhea Muscle spasms Stomach pain This list may not describe all possible side effects. Call your doctor for medical advice about side effects. You may report side effects to FDA at 1-800-FDA-1088. Where should I keep my medication? Keep out of the reach of children and pets. Store between 15 and 30 degrees C (59 and 86 degrees F). Keep this medication in the original container. Protect from moisture. Keep the container tightly closed.  Do not throw out the packet in the container. It keeps the medication dry. Get rid of any unused medication after the expiration date. To get rid of medications that are no longer needed or have expired: Take the medication to a medication take-back program. Check with your pharmacy or law enforcement to find a location. If you cannot return the medication, check the label or package insert to see if the medication should be thrown out in the trash or flushed down the toilet. If you are not sure, ask your care team. If it is safe to put it in the trash, empty the medication out of the container. Mix the medication with cat litter, dirt, coffee grounds, or other unwanted substance. Seal the mixture in a bag or container. Put it in the trash. NOTE: This sheet is a summary. It may not cover all possible information. If you have questions about this medicine, talk to your doctor, pharmacist, or health care provider.  2023 Elsevier/Gold Standard (2021-10-31 00:00:00)

## 2023-02-15 NOTE — Progress Notes (Signed)
Office Visit    Patient Name: Ronald Lawson Date of Encounter: 02/15/2023  PCP:  Earlyne Iba, NP   [place]  Cardiologist:  Skeet Latch, MD  Advanced Practice Provider:  No care team member to display Electrophysiologist:  None      Chief Complaint    Ronald Lawson is a 52 y.o. male presents today for hyperlipidemia   Past Medical History    Past Medical History:  Diagnosis Date   Aortic atherosclerosis 01/11/2022   Pure hypercholesterolemia 10/12/2021   History reviewed. No pertinent surgical history.  Allergies  Not on File  History of Present Illness    Ronald Lawson is a 52 y.o. male with a hx of hyperlipidemia, bradycardia, anxiety, aortic atherosclerosis last seen 11/15/2022  Initially saw Dr. Oval Linsey 09/2021 for high cholesterol.  He preferred to avoid statins is much as possible.  NMR, lipid panel with high LDL particle, LPA low.  Calcium score 10/2021 with no plaque.  He was started on rosuvastatin initially noted headaches.  He was willing to retrial at a frequency of 3 times per week at last office visit however subsequently noted vivid dreams and more shortness of breath and discontinued.  Presents today for follow-up.  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  PCP has recommended and prescribed Zetia but he wished to discuss with [place] first.  We discussed pharmacokinetics of Zetia and encouraged to take. Discussed aortic atherosclerosis and need for optimal lipid control.  Reports no shortness of breath nor dyspnea on exertion. Reports no chest pain, pressure, or tightness. No edema, orthopnea, PND. Reports no palpitations.    Labs via [place]: 09/26/2022 LDL 202, total cholesterol 264, HDL 49, triglycerides 79. 09/26/2022 ALT 30, AST 24, creatinine 1.14, GFR 77, hemoglobin 15.4. 01/31/2023 ALT 33, AST 21 01/31/2023 total cholesterol 264, HDL 55, LDL 194, triglycerides 89  EKGs/Labs/Other Studies Reviewed:   The  following studies were reviewed today:  CT cardiac score 11/11/21 IMPRESSION: 1.  Aortic Atherosclerosis (ICD10-I70.0).   FINDINGS: Coronary arteries: Normal origins.   Coronary Calcium Score: 0   Pericardium: Normal.   Ascending Aorta: Normal caliber.   Non-cardiac: See separate report from [facility].   IMPRESSION: Coronary calcium score of 0. This was 0 percentile for age-, race-, and sex-matched controls.  EKG:  EKG is not ordered today.   Recent Labs: No results found for requested labs within last 365 days.  Recent Lipid Panel No results found for: "CHOL", "TRIG", "HDL", "CHOLHDL", "VLDL", "LDLCALC", "LDLDIRECT"  Home Medications   Current Meds  Medication Sig   citalopram (CELEXA) 10 MG tablet Take 5 mg by mouth daily. Take with 20 mg to make 25 mg total   citalopram (CELEXA) 20 MG tablet 20 mg. Take with 1/2 of 10 mg(5 mg) to make 25 mg total   ezetimibe (ZETIA) 10 MG tablet Take 10 mg by mouth daily.   Multiple Vitamins-Minerals (MENS MULTIVITAMIN PO) Take 1 tablet by mouth daily. Take one [employer] Men's Once Daily Multivitamin tablet daily.     Review of Systems      All other systems reviewed and are otherwise negative except as noted above.  Physical Exam    VS:  BP 100/65 (BP Location: Left Arm, Patient Position: Sitting, Cuff Size: Large)   Pulse (!) 59   Ht 6\' 4"  (1.93 m)   Wt 221 lb 1.6 oz (100.3 kg)   SpO2 98%   BMI 26.91  kg/m  , BMI Body mass index is 26.91 kg/m.  Wt Readings from Last 3 Encounters:  02/15/23 221 lb 1.6 oz (100.3 kg)  11/15/22 216 lb 3.2 oz (98.1 kg)  01/11/22 206 lb 8 oz (93.7 kg)    GEN: Well nourished, well developed, in no acute distress. HEENT: normal. Neck: Supple, no JVD, carotid bruits, or masses. Cardiac: RRR, no murmurs, rubs, or gallops. No clubbing, cyanosis, edema.  Radials/PT 2+ and equal bilaterally.  Respiratory:  Respirations regular and unlabored, clear to auscultation bilaterally. GI:  Soft, nontender, nondistended. MS: No deformity or atrophy. Skin: Warm and dry, no rash. Neuro:  Strength and sensation are intact. Psych: Normal affect.  Assessment & Plan    Aortic atherosclerosis/hyperlipidemia, LDL goal 70-11/30/2022 lipoprotein a less than 8.4.  09/2021 NMR with LDL 202, HDL 28.9, small LDL particle number 1036.  01/2023 LDL 94.  Encouraged to start Zetia as recommended by PCP.  Plan for FLP/LFT 6 weeks after starting.  We discussed addition of  Bempedoic acid, or PCSK9i as next agent if needed. Heart healthy diet and regular cardiovascular exercise encouraged.    Sinus bradycardia - asymptomatic with no lightheaded, dizziness. Avoid AV nodal blocking agent.        Disposition: Follow up in 6 months with Loel Dubonnet, NP or Dr. Oval Linsey (Prefers to stay at [facility] location)  Signed, Loel Dubonnet, NP 02/15/2023, 5:09 PM [place]

## 2023-07-02 ENCOUNTER — Ambulatory Visit (HOSPITAL_BASED_OUTPATIENT_CLINIC_OR_DEPARTMENT_OTHER): Payer: Commercial Managed Care - HMO | Admitting: Family

## 2023-09-26 ENCOUNTER — Other Ambulatory Visit: Payer: Self-pay | Admitting: Physician Assistant

## 2023-09-26 DIAGNOSIS — M5412 Radiculopathy, cervical region: Secondary | ICD-10-CM

## 2023-10-17 ENCOUNTER — Other Ambulatory Visit: Payer: Self-pay

## 2024-08-20 ENCOUNTER — Other Ambulatory Visit: Payer: Self-pay | Admitting: Physician Assistant

## 2024-08-20 DIAGNOSIS — M5412 Radiculopathy, cervical region: Secondary | ICD-10-CM

## 2024-09-02 NOTE — Discharge Instructions (Signed)

## 2024-09-03 ENCOUNTER — Ambulatory Visit
Admission: RE | Admit: 2024-09-03 | Discharge: 2024-09-03 | Disposition: A | Discharge status: Home / Self Care | Source: Ambulatory Visit | Attending: Physician Assistant | Admitting: Physician Assistant

## 2024-09-03 DIAGNOSIS — M5412 Radiculopathy, cervical region: Secondary | ICD-10-CM

## 2024-09-03 MED ORDER — IOPAMIDOL (ISOVUE-M 300) INJECTION 61%
1.0000 mL | Freq: Once | INTRAMUSCULAR | Status: AC | PRN
Start: 2024-09-03 — End: 2024-09-03
  Administered 2024-09-03: 1 mL via EPIDURAL

## 2024-09-03 MED ORDER — TRIAMCINOLONE ACETONIDE 40 MG/ML IJ SUSP (RADIOLOGY)
60.0000 mg | Freq: Once | INTRAMUSCULAR | Status: AC
  Administered 2024-09-03: 60 mg via EPIDURAL
